# Patient Record
Sex: Male | Born: 1960 | Race: White | Hispanic: No | Marital: Married | Smoking: Former smoker
Health system: Southern US, Community
[De-identification: ages and names within clinical notes are randomized; demographics above are authoritative.]

## PROBLEM LIST (undated history)

## (undated) DIAGNOSIS — E119 Type 2 diabetes mellitus without complications: Secondary | ICD-10-CM

---

## 2001-11-04 ENCOUNTER — Emergency Department (HOSPITAL_COMMUNITY): Admission: EM | Admit: 2001-11-04 | Discharge: 2001-11-05 | Payer: Self-pay | Admitting: Emergency Medicine

## 2009-03-25 ENCOUNTER — Ambulatory Visit (HOSPITAL_COMMUNITY): Admission: RE | Admit: 2009-03-25 | Discharge: 2009-03-25 | Payer: Self-pay | Admitting: Cardiology

## 2009-04-25 ENCOUNTER — Ambulatory Visit: Payer: Self-pay | Admitting: Internal Medicine

## 2009-04-25 DIAGNOSIS — E669 Obesity, unspecified: Secondary | ICD-10-CM

## 2009-04-25 DIAGNOSIS — K7689 Other specified diseases of liver: Secondary | ICD-10-CM

## 2009-04-27 ENCOUNTER — Encounter: Payer: Self-pay | Admitting: Internal Medicine

## 2009-05-18 ENCOUNTER — Encounter: Payer: Self-pay | Admitting: Internal Medicine

## 2010-04-08 ENCOUNTER — Encounter: Payer: Self-pay | Admitting: Family Medicine

## 2010-04-17 NOTE — Letter (Signed)
Summary: [facility]   Imported By: Diana Eves 04/27/2009 10:41:45  _____________________________________________________________________  External Attachment:    Type:   Image     Comment:   External Document

## 2010-04-17 NOTE — Assessment & Plan Note (Signed)
Summary: NPP/FATTY LIVER/GU   Visit Type:  Consult Primary Care Provider:  Patrica Duel, MD  Chief Complaint:  fatty liver.  History of Present Illness: [name] is a pleasant 50 year old Caucasian gentleman, patient of Dr. Patrica Duel, who presents today for further evaluation of fatty liver. He was seen by Melony Overly, PA-C with Dr. Nobie Putnam. He presented to her with complaints of possible hernia. He noted a bulge at the top of the stomach. He states that it is uncomfortable at times. He just feels an achiness in his abdomen at times but no discrete pain. Denies any problems with his bowel movements. He usually has 1-2 stools per day. Denies any blood in stool or melena. No fever or chills. No nausea or vomiting. He has heartburn occasionally with certain foods. He had CT abd recently which showed fatty infiltration of the liver. There was slight bulging of the anterior abdominal wall at the umbilicus but no frank hernia. Spleen appeared normal. Recent labs showed normal LFTs, CBC, TSH was 5.14 He was started on Synthroid.  He c/o pp fecal urgency at times when he eats out.Marland Kitchen He's concerned about his abdominal girth. He admits to eating frequent fried foods. He basically gets very little exercise outside of walking with his job. 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 He denies any alcohol use and no prior history.  He's never been a smoker. He denies any family history of liver disease.       Current Medications (verified): 1)  Levothyroxine Sodium 25 Mcg Tabs (Levothyroxine Sodium) .... Once Daily 2)  Advil 200 Mg Tabs (Ibuprofen) .... As Needed  Allergies (verified): No Known Drug Allergies  Past History:  Past Medical History: Hypothyroidism  Past Surgical History: Unremarkable  Family History: Father, prostate cancer Mother, breast cancer Paternal uncle, throat cancer Paternal Aunt, pancreatic cancer Maternal aunt, pancreatic cancer No FH of  CRC, liver disease.  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 One son, age 60.  Review of Systems General:  Denies fever, chills, sweats, anorexia, weakness, and weight loss. Eyes:  Denies vision loss. ENT:  Denies nasal congestion, hoarseness, and difficulty swallowing. CV:  Denies chest pains, angina, palpitations, dyspnea on exertion, and peripheral edema. Resp:  Denies dyspnea at rest, dyspnea with exercise, and cough. GI:  See HPI; Denies difficulty swallowing, pain on swallowing, and gas/bloating. GU:  Denies urinary burning and blood in urine. MS:  Denies joint pain / LOM. Derm:  Denies rash and itching. Neuro:  Denies weakness, frequent headaches, memory loss, and confusion. Psych:  Denies depression and anxiety. Endo:  Denies unusual weight change. Heme:  Denies bruising and bleeding. Allergy:  Denies hives and rash.  Vital Signs:  Patient profile:   50 year old male Height:      72 inches Weight:      223 pounds BMI:     30.35 Temp:     98.1 degrees F oral Pulse rate:   76 / minute BP sitting:   122 / 84  (left arm) Cuff size:   regular  Vitals Entered By: Hendricks Limes LPN (April 25, 2009 10:44 AM)  Nutrition Counseling: Patient's BMI is greater than 25 and therefore counseled on weight management options.  Physical Exam  General:  Well developed, well nourished, no acute distress.obese.   Head:  Normocephalic and atraumatic. Eyes:  Conjunctivae pink, no scleral icterus.  Mouth:  Oropharyngeal mucosa moist, pink.  No lesions,  erythema or exudate.    Neck:  Supple; no masses or thyromegaly. Lungs:  Clear throughout to auscultation. Heart:  Regular rate and rhythm; no murmurs, rubs,  or bruits. Abdomen:  normal bowel sounds and obese.  Ventral weakness. Mild tenderness in midline above umbilicus. No rebound or guarding. No HSM or masses. No abd bruit.  Extremities:  No clubbing, cyanosis, edema or deformities  noted. Neurologic:  Alert and  oriented x4;  grossly normal neurologically. Skin:  Intact without significant lesions or rashes. Cervical Nodes:  No significant cervical adenopathy. Psych:  Alert and cooperative. Normal mood and affect.  Impression & Recommendations:  Problem # 1:  FATTY LIVER DISEASE (ICD-571.8)  Recent CT done to r/o ventral hernia demonstrated fatty liver. No hepatosplenomegaly. His LFTs are normal which is reassuring. Discussed importance in weight control. His BMI is >30, classified as obese. Recommend low-fat diet, 30 mins of brisk walking daily, goal of 1-2 pound weight loss per week until at healthy BMI. Goal of 10 pounds over the next 2 months. OV in six months with Dr. Jena Gauss. Fatty liver information pamphlet provided. Will also retrieve old endo reports from [facility] for review.   Time spent face-to-face encounter greater than 30 mins.   Orders: Consultation Level III ([id number]) I would like to thank Dr. Nobie Putnam for allowing [place] to take part in the care of this nice patient.   Appended Document: NPP/FATTY LIVER/GU EGD by Dr. Jena Gauss, 11/98 --> 2cm hiatal hernia, otherwise normal. No prior TCS. Recommend TCS at age 33, 06/2011.

## 2010-04-17 NOTE — Op Note (Signed)
Summary: EGD 1998  EGD 1998   Imported By: Diana Eves 05/18/2009 10:54:50  _____________________________________________________________________  External Attachment:    Type:   Image     Comment:   External Document

## 2011-05-03 IMAGING — CT CT ABDOMEN W/ CM
3 of 5 series · 17 of 46 positions shown, 19 images · IV contrast (Omnipaque 300)
Comparison: None.

CLINICAL DATA: Ventral hernia

CT ABDOMEN WITH CONTRAST
TECHNIQUE: Multidetector CT imaging of the abdomen was performed
using the standard protocol following bolus administration of
intravenous contrast.
Contrast: 100 ml 1mnipaque-T22

[Series 3: lung with 5.0 b60f · axial · 0.80mm/px · z∈[-15,+10]mm · 2 of 22 slices shown]
[im 6/22  bone]
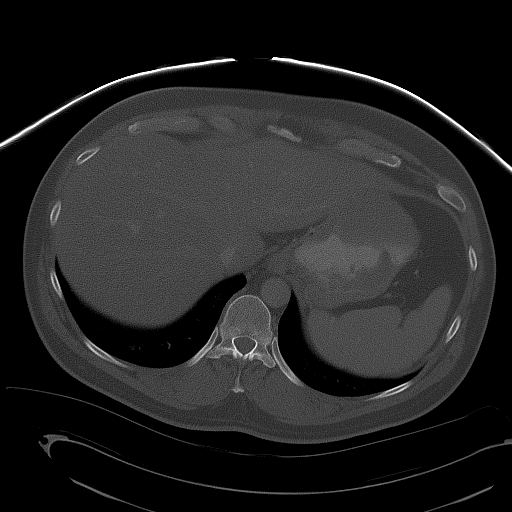
[im 11/22  bone]
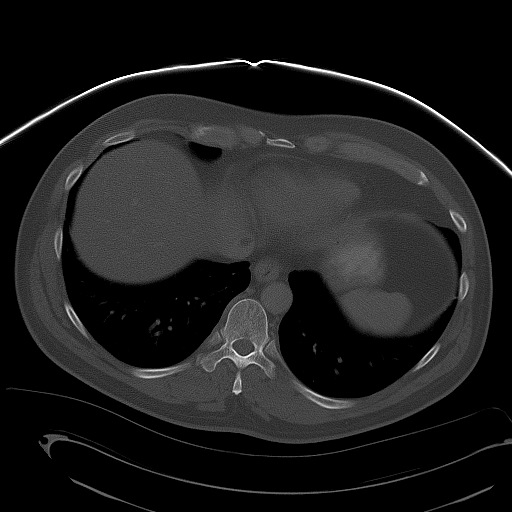

[Series 4: mpr cor post contrast (id) · coronal · 0.68mm/px · 3 of 98 slices shown]
[im 33/98  soft-tissue]
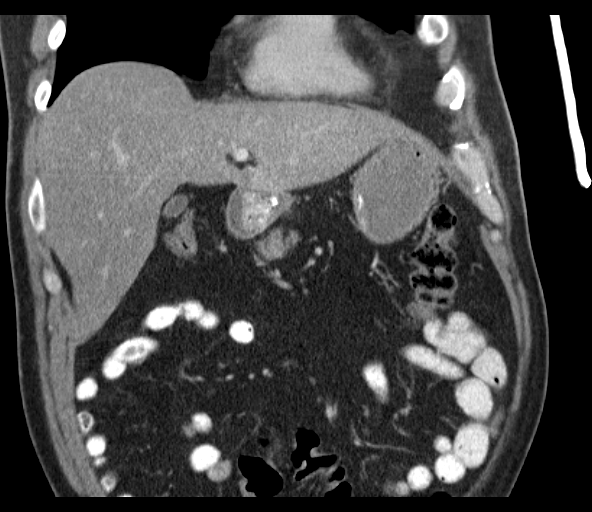
[im 44/98  soft-tissue]
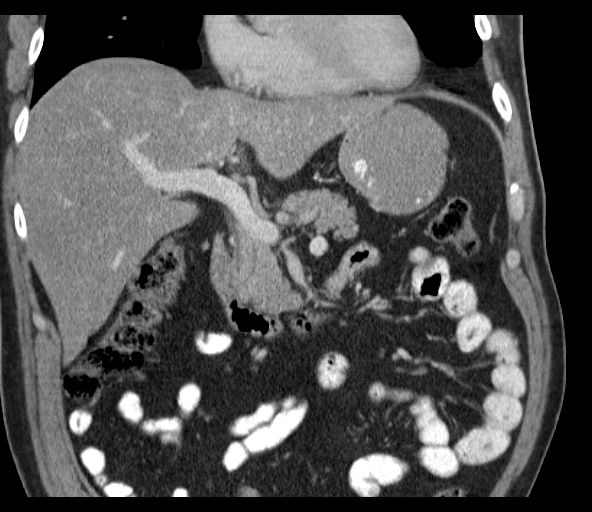
[im 54/98  soft-tissue]
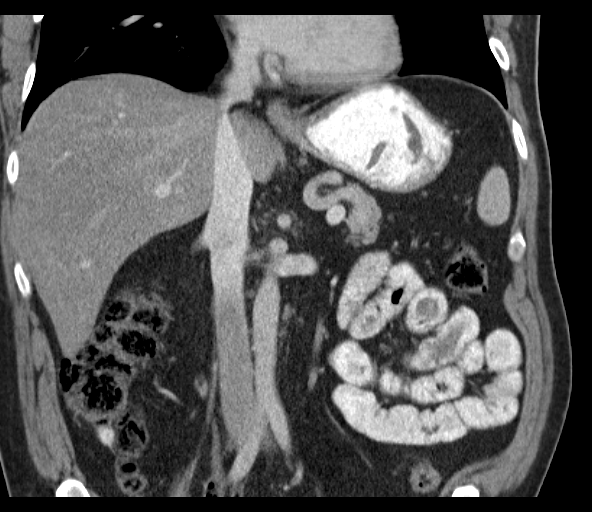

[Series 7: delay kidney · axial · delayed · 0.91mm/px · z∈[-235,+40]mm · 12 of 66 slices shown, 14 images]
[im 6/66  soft-tissue]
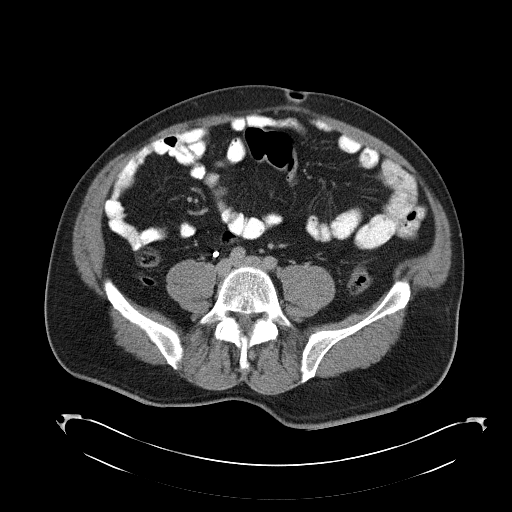
[im 6/66  bone]
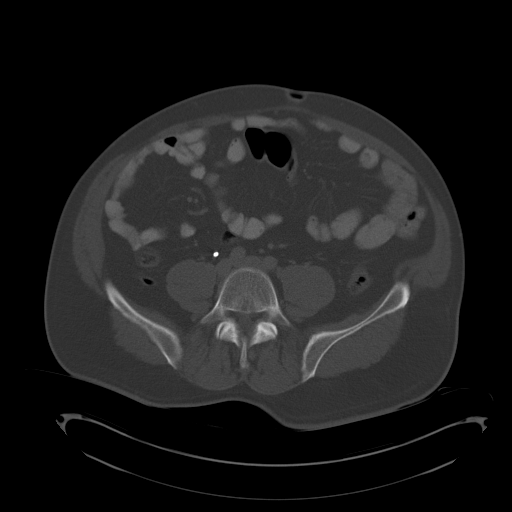
[im 11/66  soft-tissue]
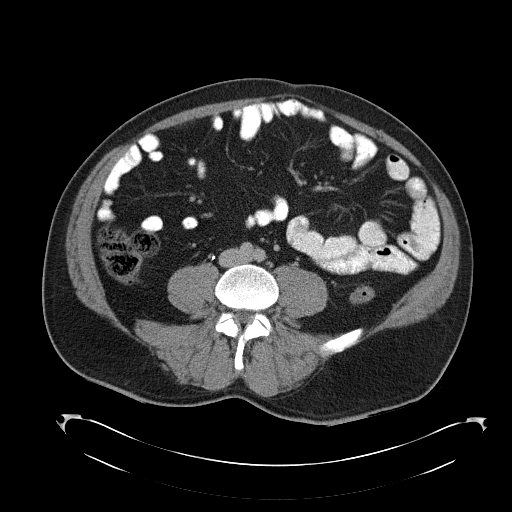
[im 16/66  soft-tissue]
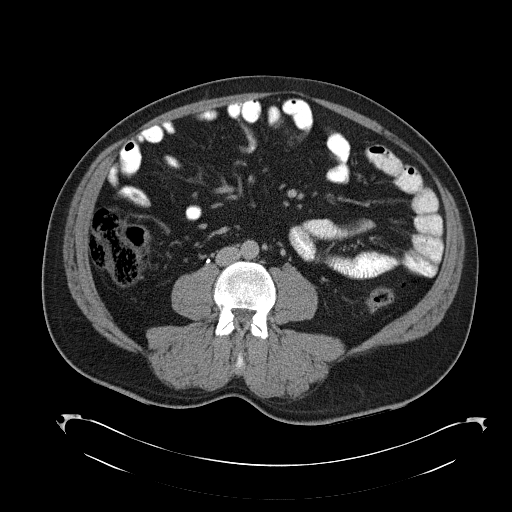
[im 21/66  soft-tissue]
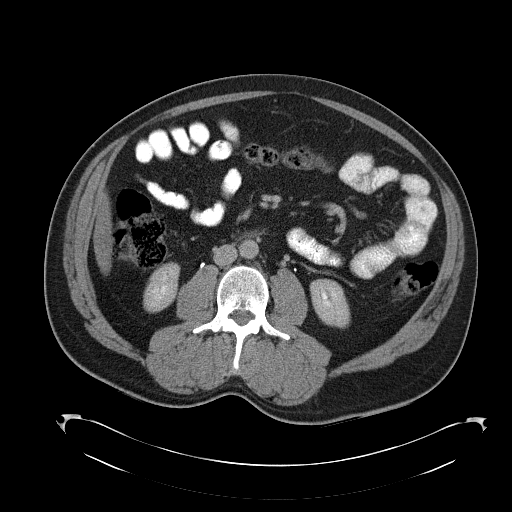
[im 26/66  soft-tissue]
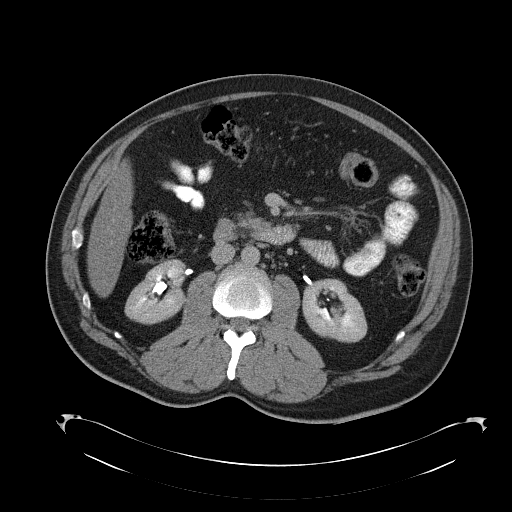
[im 31/66  soft-tissue]
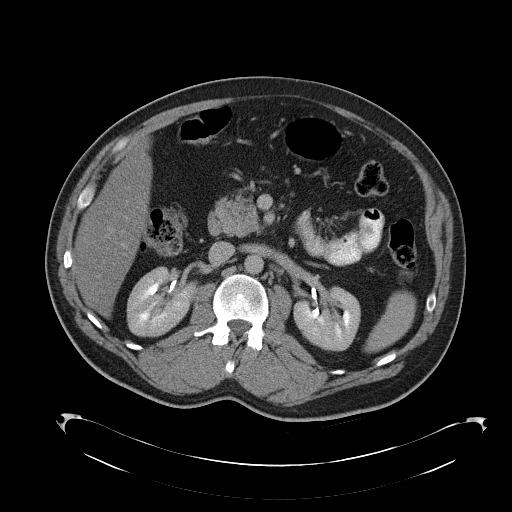
[im 36/66  soft-tissue]
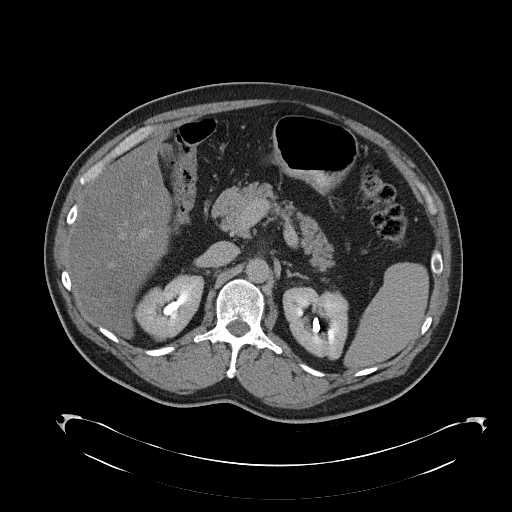
[im 41/66  soft-tissue]
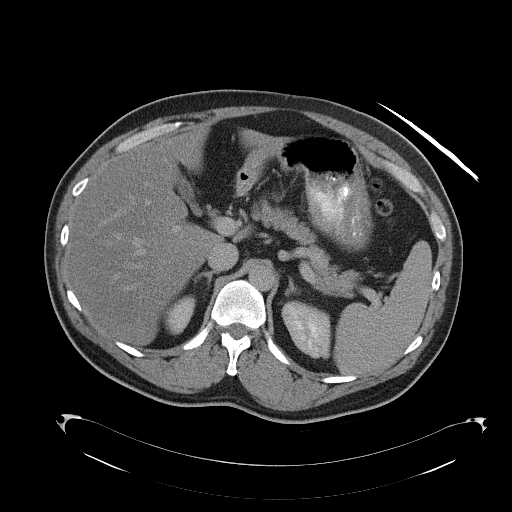
[im 46/66  soft-tissue]
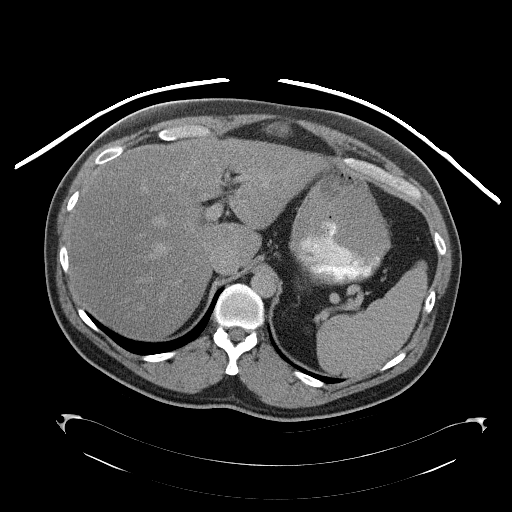
[im 46/66  bone]
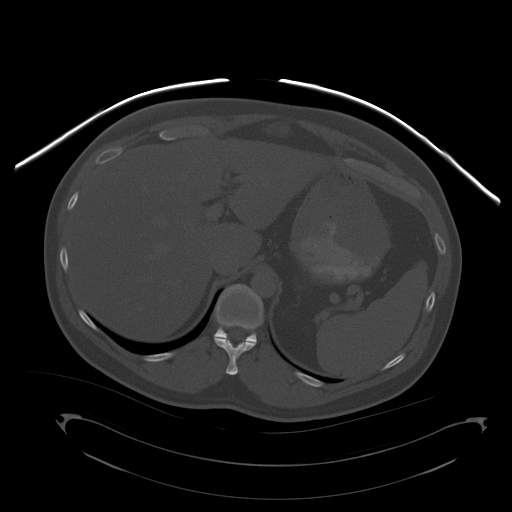
[im 51/66  soft-tissue]
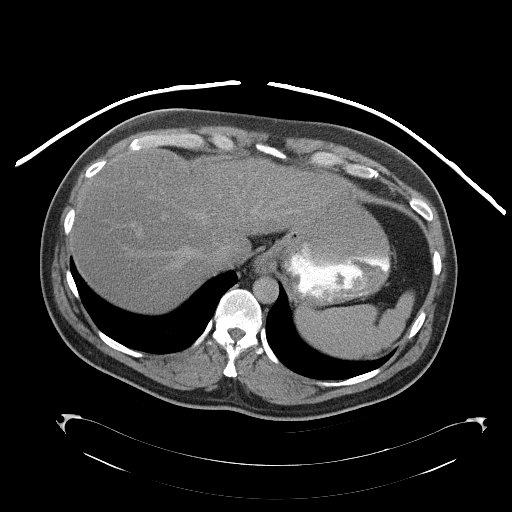
[im 56/66  soft-tissue]
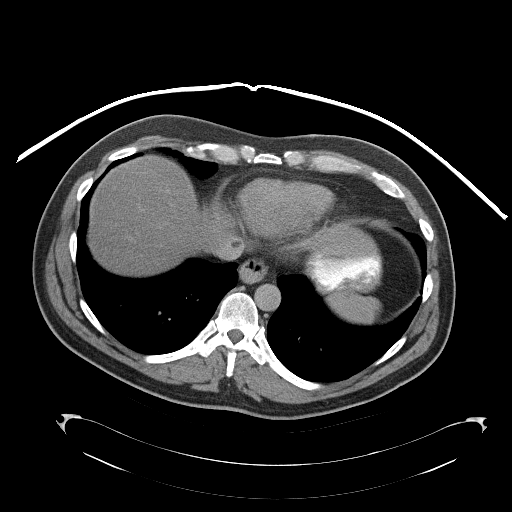
[im 61/66  soft-tissue]
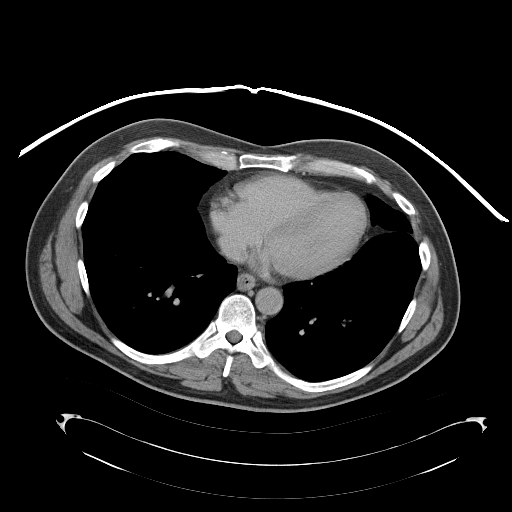

[17 of 46 positions shown; findings below may reference images not displayed]

FINDINGS: Diffuse fatty infiltration of the liver.  Gallbladder is
decompressed.  Spleen, pancreas, right kidney are within normal
limits.  Simple cyst in the upper pole of the left kidney.  There
is slight outward bulging of the anterior abdominal wall at the
umbilicus but no frank hernia.  The appendix is partially imaged.
The visualized portion is unremarkable.  Bowel is decompressed.  No
free fluid.  No abnormal adenopathy.
IMPRESSION: Fatty liver.

Simple cyst in the left kidney.

No definite ventral hernia.

## 2011-06-11 ENCOUNTER — Encounter: Payer: Self-pay | Admitting: Internal Medicine

## 2017-04-15 ENCOUNTER — Ambulatory Visit: Payer: No Typology Code available for payment source | Admitting: Urology

## 2017-04-15 DIAGNOSIS — R972 Elevated prostate specific antigen [PSA]: Secondary | ICD-10-CM | POA: Diagnosis not present

## 2017-04-16 ENCOUNTER — Other Ambulatory Visit: Payer: Self-pay | Admitting: Urology

## 2017-04-16 DIAGNOSIS — R972 Elevated prostate specific antigen [PSA]: Secondary | ICD-10-CM

## 2017-04-29 ENCOUNTER — Ambulatory Visit (HOSPITAL_COMMUNITY)
Admission: RE | Admit: 2017-04-29 | Discharge: 2017-04-29 | Disposition: A | Discharge status: Home / Self Care | Payer: No Typology Code available for payment source | Source: Ambulatory Visit | Attending: Urology | Admitting: Urology

## 2017-04-29 ENCOUNTER — Encounter (HOSPITAL_COMMUNITY): Payer: Self-pay

## 2017-04-29 DIAGNOSIS — Z79899 Other long term (current) drug therapy: Secondary | ICD-10-CM | POA: Diagnosis not present

## 2017-04-29 DIAGNOSIS — Z7984 Long term (current) use of oral hypoglycemic drugs: Secondary | ICD-10-CM | POA: Insufficient documentation

## 2017-04-29 DIAGNOSIS — Z8042 Family history of malignant neoplasm of prostate: Secondary | ICD-10-CM | POA: Insufficient documentation

## 2017-04-29 DIAGNOSIS — N521 Erectile dysfunction due to diseases classified elsewhere: Secondary | ICD-10-CM | POA: Diagnosis not present

## 2017-04-29 DIAGNOSIS — E119 Type 2 diabetes mellitus without complications: Secondary | ICD-10-CM | POA: Insufficient documentation

## 2017-04-29 DIAGNOSIS — N4231 Prostatic intraepithelial neoplasia: Secondary | ICD-10-CM | POA: Diagnosis not present

## 2017-04-29 DIAGNOSIS — R972 Elevated prostate specific antigen [PSA]: Secondary | ICD-10-CM | POA: Diagnosis not present

## 2017-04-29 DIAGNOSIS — K219 Gastro-esophageal reflux disease without esophagitis: Secondary | ICD-10-CM | POA: Insufficient documentation

## 2017-04-29 DIAGNOSIS — I771 Stricture of artery: Secondary | ICD-10-CM | POA: Diagnosis not present

## 2017-04-29 HISTORY — DX: Type 2 diabetes mellitus without complications: E11.9

## 2017-04-29 MED ORDER — LIDOCAINE HCL (PF) 2 % IJ SOLN
INTRAMUSCULAR | Status: AC
  Administered 2017-04-29: 10 mL
  Filled 2017-04-29: qty 10

## 2017-04-29 MED ORDER — GENTAMICIN SULFATE 40 MG/ML IJ SOLN
INTRAMUSCULAR | Status: AC
  Administered 2017-04-29: 160 mg via INTRAMUSCULAR
  Filled 2017-04-29: qty 4

## 2017-04-29 MED ORDER — LIDOCAINE HCL (PF) 2 % IJ SOLN
10.0000 mL | Freq: Once | INTRAMUSCULAR | Status: AC
  Administered 2017-04-29: 10 mL

## 2017-04-29 MED ORDER — GENTAMICIN SULFATE 40 MG/ML IJ SOLN
160.0000 mg | Freq: Once | INTRAMUSCULAR | Status: AC
  Administered 2017-04-29: 160 mg via INTRAMUSCULAR

## 2017-04-29 NOTE — Discharge Instructions (Signed)
Transrectal Ultrasound-Guided Biopsy °A transrectal ultrasound-guided biopsy is a procedure to remove samples of tissue from your prostate using ultrasound images to guide the procedure. The procedure is usually done to evaluate the prostate gland of men who have an elevated prostate-specific antigen (PSA). PSA is a blood test to screen for prostate cancer. The biopsy samples are taken to check for prostate cancer. °Tell a health care provider about: °· Any allergies you have. °· All medicines you are taking, including vitamins, herbs, eye drops, creams, and over-the-counter medicines. °· Any problems you or family members have had with anesthetic medicines. °· Any blood disorders you have. °· Any surgeries you have had. °· Any medical conditions you have. °What are the risks? °Generally, this is a safe procedure. However, as with any procedure, problems can occur. Possible problems include: °· Infection of your prostate. °· Bleeding from your rectum or blood in your urine. °· Difficulty urinating. °· Nerve damage (this is usually temporary). °· Damage to surrounding structures such as blood vessels, organs, and muscles, which would require other procedures. ° °What happens before the procedure? °· Do not eat or drink anything after midnight on the night before the procedure or as directed by your health care provider. °· Take medicines only as directed by your health care provider. °· Your health care provider may have you stop taking certain medicines 5-7 days before the procedure. °· You will be given an enema before the procedure. During an enema, a liquid is injected into your rectum to clear out waste. °· You may have lab tests the day of your procedure. °· Plan to have someone take you home after the procedure. °What happens during the procedure? °· You will be given medicine to help you relax (sedative) before the procedure. An IV tube will be inserted into one of your veins and used to give fluids and  medicine. °· You will be given antibiotic medicine to reduce the risk of an infection. °· You will be placed on your side for the procedure. °· A probe with lubricated gel will be placed into your rectum, and images will be taken of your prostate and surrounding structures. °· Numbing medicine will be injected into the prostate before the biopsy samples are taken. °· A biopsy needle will then be inserted and guided to your prostate with the use of the ultrasound images. °· Samples of prostate tissue will be taken, and the needle will then be removed. °· The biopsy samples will be sent to a lab to be analyzed. Results are usually back in 2-3 days. °What happens after the procedure? °· You will be taken to a recovery area where you will be monitored. °· You may have some discomfort in the rectal area. You will be given pain medicines to control this. °· You may be allowed to go home the same day, or you may need to stay in the hospital overnight. °This information is not intended to replace advice given to you by your health care provider. Make sure you discuss any questions you have with your health care provider. °Document Released: 07/19/2013 Document Revised: 08/10/2015 Document Reviewed: 10/21/2012 °Elsevier Interactive Patient Education © 2018 [employer] ° °

## 2017-11-04 ENCOUNTER — Ambulatory Visit (INDEPENDENT_AMBULATORY_CARE_PROVIDER_SITE_OTHER): Payer: No Typology Code available for payment source | Admitting: Urology

## 2017-11-04 DIAGNOSIS — R972 Elevated prostate specific antigen [PSA]: Secondary | ICD-10-CM | POA: Diagnosis not present

## 2020-02-25 DIAGNOSIS — R7989 Other specified abnormal findings of blood chemistry: Secondary | ICD-10-CM | POA: Diagnosis not present

## 2020-04-21 DIAGNOSIS — E663 Overweight: Secondary | ICD-10-CM | POA: Diagnosis not present

## 2020-04-21 DIAGNOSIS — E291 Testicular hypofunction: Secondary | ICD-10-CM | POA: Diagnosis not present

## 2020-04-21 DIAGNOSIS — E119 Type 2 diabetes mellitus without complications: Secondary | ICD-10-CM | POA: Diagnosis not present

## 2020-04-21 DIAGNOSIS — E1165 Type 2 diabetes mellitus with hyperglycemia: Secondary | ICD-10-CM | POA: Diagnosis not present

## 2020-04-21 DIAGNOSIS — Z6829 Body mass index (BMI) 29.0-29.9, adult: Secondary | ICD-10-CM | POA: Diagnosis not present

## 2020-04-21 DIAGNOSIS — I10 Essential (primary) hypertension: Secondary | ICD-10-CM | POA: Diagnosis not present

## 2020-05-03 DIAGNOSIS — J22 Unspecified acute lower respiratory infection: Secondary | ICD-10-CM | POA: Diagnosis not present

## 2020-07-19 DIAGNOSIS — E119 Type 2 diabetes mellitus without complications: Secondary | ICD-10-CM | POA: Diagnosis not present

## 2020-07-21 DIAGNOSIS — E291 Testicular hypofunction: Secondary | ICD-10-CM | POA: Diagnosis not present

## 2020-07-21 DIAGNOSIS — E7849 Other hyperlipidemia: Secondary | ICD-10-CM | POA: Diagnosis not present

## 2020-07-21 DIAGNOSIS — Z1389 Encounter for screening for other disorder: Secondary | ICD-10-CM | POA: Diagnosis not present

## 2020-07-21 DIAGNOSIS — E1165 Type 2 diabetes mellitus with hyperglycemia: Secondary | ICD-10-CM | POA: Diagnosis not present

## 2020-07-21 DIAGNOSIS — Z6829 Body mass index (BMI) 29.0-29.9, adult: Secondary | ICD-10-CM | POA: Diagnosis not present

## 2020-07-21 DIAGNOSIS — Z1331 Encounter for screening for depression: Secondary | ICD-10-CM | POA: Diagnosis not present

## 2020-07-21 DIAGNOSIS — R972 Elevated prostate specific antigen [PSA]: Secondary | ICD-10-CM | POA: Diagnosis not present

## 2020-07-21 DIAGNOSIS — E663 Overweight: Secondary | ICD-10-CM | POA: Diagnosis not present

## 2020-09-29 DIAGNOSIS — E291 Testicular hypofunction: Secondary | ICD-10-CM | POA: Diagnosis not present

## 2020-10-27 DIAGNOSIS — I10 Essential (primary) hypertension: Secondary | ICD-10-CM | POA: Diagnosis not present

## 2020-10-27 DIAGNOSIS — E291 Testicular hypofunction: Secondary | ICD-10-CM | POA: Diagnosis not present

## 2020-10-27 DIAGNOSIS — E119 Type 2 diabetes mellitus without complications: Secondary | ICD-10-CM | POA: Diagnosis not present

## 2020-10-27 DIAGNOSIS — E663 Overweight: Secondary | ICD-10-CM | POA: Diagnosis not present

## 2020-10-27 DIAGNOSIS — E039 Hypothyroidism, unspecified: Secondary | ICD-10-CM | POA: Diagnosis not present

## 2020-10-27 DIAGNOSIS — Z0001 Encounter for general adult medical examination with abnormal findings: Secondary | ICD-10-CM | POA: Diagnosis not present

## 2020-10-27 DIAGNOSIS — E785 Hyperlipidemia, unspecified: Secondary | ICD-10-CM | POA: Diagnosis not present

## 2020-10-27 DIAGNOSIS — Z6829 Body mass index (BMI) 29.0-29.9, adult: Secondary | ICD-10-CM | POA: Diagnosis not present

## 2020-10-27 DIAGNOSIS — Z23 Encounter for immunization: Secondary | ICD-10-CM | POA: Diagnosis not present

## 2020-10-27 DIAGNOSIS — R2233 Localized swelling, mass and lump, upper limb, bilateral: Secondary | ICD-10-CM | POA: Diagnosis not present

## 2020-12-11 DIAGNOSIS — M79642 Pain in left hand: Secondary | ICD-10-CM | POA: Diagnosis not present

## 2020-12-11 DIAGNOSIS — M064 Inflammatory polyarthropathy: Secondary | ICD-10-CM | POA: Diagnosis not present

## 2020-12-11 DIAGNOSIS — M79641 Pain in right hand: Secondary | ICD-10-CM | POA: Diagnosis not present

## 2020-12-11 DIAGNOSIS — R768 Other specified abnormal immunological findings in serum: Secondary | ICD-10-CM | POA: Diagnosis not present

## 2020-12-11 DIAGNOSIS — R5383 Other fatigue: Secondary | ICD-10-CM | POA: Diagnosis not present

## 2021-01-09 DIAGNOSIS — R768 Other specified abnormal immunological findings in serum: Secondary | ICD-10-CM | POA: Diagnosis not present

## 2021-01-09 DIAGNOSIS — M064 Inflammatory polyarthropathy: Secondary | ICD-10-CM | POA: Diagnosis not present

## 2022-03-05 DIAGNOSIS — R1032 Left lower quadrant pain: Secondary | ICD-10-CM | POA: Diagnosis present

## 2022-03-06 ENCOUNTER — Other Ambulatory Visit: Payer: Self-pay

## 2022-03-06 ENCOUNTER — Emergency Department (HOSPITAL_COMMUNITY)
Admission: EM | Admit: 2022-03-06 | Discharge: 2022-03-06 | Disposition: A | Discharge status: Home / Self Care | Payer: BC Managed Care – PPO | Attending: Emergency Medicine | Admitting: Emergency Medicine

## 2022-03-06 ENCOUNTER — Emergency Department (HOSPITAL_COMMUNITY): Payer: BC Managed Care – PPO

## 2022-03-06 DIAGNOSIS — R109 Unspecified abdominal pain: Secondary | ICD-10-CM

## 2022-03-06 LAB — URINALYSIS, ROUTINE W REFLEX MICROSCOPIC
Bacteria, UA: NONE SEEN
Bilirubin Urine: NEGATIVE
Glucose, UA: >=500 mg/dL — AB
Hgb urine dipstick: NEGATIVE
Ketones, ur: NEGATIVE mg/dL
Leukocytes,Ua: NEGATIVE
Nitrite: NEGATIVE
Protein, ur: NEGATIVE mg/dL
Specific Gravity, Urine: 1.03 (ref 1.005–1.030)
pH: 5 (ref 5.0–8.0)

## 2022-03-06 LAB — CBC
HCT: 46.3 % (ref 39.0–52.0)
Hemoglobin: 16.7 g/dL (ref 13.0–17.0)
MCH: 29.9 pg (ref 26.0–34.0)
MCHC: 36.1 g/dL — ABNORMAL HIGH (ref 30.0–36.0)
MCV: 83 fL (ref 80.0–100.0)
Platelets: 374 10*3/uL (ref 150–400)
RBC: 5.58 MIL/uL (ref 4.22–5.81)
RDW: 13 % (ref 11.5–15.5)
WBC: 22.4 10*3/uL — ABNORMAL HIGH (ref 4.0–10.5)
nRBC: 0 % (ref 0.0–0.2)

## 2022-03-06 LAB — BASIC METABOLIC PANEL
Anion gap: 9 (ref 5–15)
BUN: 28 mg/dL — ABNORMAL HIGH (ref 6–20)
CO2: 19 mmol/L — ABNORMAL LOW (ref 22–32)
Calcium: 9.1 mg/dL (ref 8.9–10.3)
Chloride: 110 mmol/L (ref 98–111)
Creatinine, Ser: 1.05 mg/dL (ref 0.61–1.24)
GFR, Estimated: >60 mL/min (ref >60)
Glucose, Bld: 261 mg/dL — ABNORMAL HIGH (ref 70–99)
Potassium: 4.1 mmol/L (ref 3.5–5.1)
Sodium: 138 mmol/L (ref 135–145)

## 2022-03-06 MED ORDER — SODIUM CHLORIDE 0.9 % IV BOLUS
1000.0000 mL | Freq: Once | INTRAVENOUS | Status: AC
  Administered 2022-03-06: 1000 mL via INTRAVENOUS

## 2022-03-06 MED ORDER — IOHEXOL 300 MG/ML  SOLN
100.0000 mL | Freq: Once | INTRAMUSCULAR | Status: AC | PRN
  Administered 2022-03-06: 100 mL via INTRAVENOUS

## 2022-03-06 NOTE — ED Provider Notes (Signed)
[facility] Provider Note   CSN: [id number] Arrival date & time: 03/05/22  2315     History  Chief Complaint  Patient presents with   Flank Pain    Luis Mayer is a 61 y.o. male.  Patient is a 61 year old male with no significant past medical history.  Patient presenting with a 2-day history of left lower quadrant and flank pain.  He also reports feeling constipated and having difficulty having a bowel movement.  He denies any fevers or chills.  He also describes some dysuria that started this evening.  He denies any aggravating or alleviating factors.  The history is provided by the patient.       Home Medications Prior to Admission medications   Not on File      Allergies    Patient has no known allergies.    Review of Systems   Review of Systems  All other systems reviewed and are negative.   Physical Exam Updated Vital Signs BP (!) 143/84 (BP Location: Right Arm)   Pulse 92   Temp 97.7 F (36.5 C) (Oral)   Resp 20   Ht 6' (1.829 m)   Wt 99.8 kg   SpO2 98%   BMI 29.84 kg/m  Physical Exam Vitals and nursing note reviewed.  Constitutional:      General: He is not in acute distress.    Appearance: He is well-developed. He is not diaphoretic.  HENT:     Head: Normocephalic and atraumatic.  Cardiovascular:     Rate and Rhythm: Normal rate and regular rhythm.     Heart sounds: No murmur heard.    No friction rub.  Pulmonary:     Effort: Pulmonary effort is normal. No respiratory distress.     Breath sounds: Normal breath sounds. No wheezing or rales.  Abdominal:     General: Bowel sounds are normal. There is no distension.     Palpations: Abdomen is soft.     Tenderness: There is abdominal tenderness. There is left CVA tenderness. There is no right CVA tenderness, guarding or rebound.  Musculoskeletal:        General: Normal range of motion.     Cervical back: Normal range of motion and neck supple.  Skin:    General: Skin is  warm and dry.  Neurological:     Mental Status: He is alert and oriented to person, place, and time.     Coordination: Coordination normal.     ED Results / Procedures / Treatments   Labs (all labs ordered are listed, but only abnormal results are displayed) Labs Reviewed  CBC - Abnormal; Notable for the following components:      Result Value   WBC 22.4 (*)    MCHC 36.1 (*)    All other components within normal limits  URINALYSIS, ROUTINE W REFLEX MICROSCOPIC  BASIC METABOLIC PANEL    EKG None  Radiology No results found.  Procedures Procedures    Medications Ordered in ED Medications  sodium chloride 0.9 % bolus 1,000 mL (has no administration in time range)    ED Course/ Medical Decision Making/ A&P  Patient is a 61 year old male presenting with complaints of left lower quadrant and left flank pain since earlier today.  He reports not having had a bowel movement in 2 days.  He also describes some burning with urination.  He arrives here with stable vital signs and physical examination reveals left lower quadrant and left flank tenderness, but  is otherwise unremarkable.  Patient complained of some difficulty urinating and bladder scan was performed.  He had greater than 1000 cc in his bladder and this was drained with a Foley catheter.  Urine sent for urinalysis which was unremarkable.  CT renal protocol performed showing evidence for recently passed stone in the left renal system, but no other acute findings.  Laboratory studies obtained including CBC and metabolic panel.  Patient has a leukocytosis with white count of 23,000, because of which I am uncertain.  There are no findings on his CT scan that would explain this.  Patient to be discharged and is to follow-up as needed.  He does have some stool retention for which I will recommend magnesium citrate.  Final Clinical Impression(s) / ED Diagnoses Final diagnoses:  None    Rx / DC Orders ED Discharge Orders      None         Geoffery Lyons, MD 03/06/22 0401

## 2022-03-06 NOTE — ED Notes (Signed)
Patient transported to CT 

## 2022-03-06 NOTE — Discharge Instructions (Signed)
Take ibuprofen 600 mg every 6 hours as needed for pain.  Follow-up with your primary doctor and return to the ER if symptoms significantly worsen or change.

## 2022-03-06 NOTE — ED Triage Notes (Signed)
Pt c/o LLQ/Flank pain and constipation. Last normal bowel movement 2 days ago. Also c/o dysuria that began tonight.

## 2022-12-18 NOTE — Progress Notes (Signed)
H&P  Chief Complaint: Elevated PSA count  History of Present Illness:   2.12.2019: TRUS/Bx. Prostate volume 53.3.ml. PSA 6.4, PSAD 0.12. 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   10.8.2024: He has not been followed here since that biopsy.  Apparently, at [facility] he has had frequent PSA levels.  I do not have any of these results.  From he denies significant urinary difficulties.  He has not been treated for any urinary infections recently.  Seen in December 2023 with probable ureteral stone.  She did have a large prostate on the CT as well as renal cysts.    Past Medical History:  Diagnosis Date   Diabetes mellitus without complication (HCC)       Home Medications:  Allergies as of 12/24/2022   No Known Allergies      Medication List    as of December 18, 2022  2:26 PM   You have not been prescribed any medications.     Allergies: No Known Allergies  No family history on file.  Social History:  reports that he has quit smoking. He has never used smokeless tobacco. He reports that he does not drink alcohol and does not use drugs.  ROS: A complete review of systems was performed.  All systems are negative except for pertinent findings as noted.  Physical Exam:  Vital signs in last 24 hours: There were no vitals taken for this visit. Constitutional:  Alert and oriented, No acute distress Cardiovascular: Regular rate  Respiratory: Normal respiratory effort GI: No Ingal will hernias Genitourinary: Normal male phallus, testes are descended bilaterally and non-tender and without masses, scrotum is normal in appearance without lesions or masses, perineum is normal on inspection.  Normal anal sphincter tone.  Prostate 60 g, symmetric, nonnodular, nontender.  Lymphatic: No lymphadenopathy Neurologic: Grossly intact, no focal deficits Psychiatric: Normal mood and affect  I have reviewed prior pt notes  I have reviewed urinalysis results  I have independently  reviewed prior imaging--CT images reviewed.  Estimated prostate volume 98 mL  I have reviewed prior PSA and pathology results    Impression/Assessment:  1.  History of elevated PSA with negative biopsy in 2019.  Inadequate follow-up since that time  2.  BPH, estimated prostate volume 98 mL on most recent CT scan.  Does not have significant lower urinary tract symptoms  Plan:  1.  I will get all PSA data from [facility] that is not available today  2.  I will reach out to patient once have reviewed these in terms of his follow-up

## 2022-12-24 ENCOUNTER — Encounter: Payer: Self-pay | Admitting: Urology

## 2022-12-24 ENCOUNTER — Ambulatory Visit (INDEPENDENT_AMBULATORY_CARE_PROVIDER_SITE_OTHER): Payer: BC Managed Care – PPO | Admitting: Urology

## 2022-12-24 VITALS — BP 126/78 | HR 81

## 2022-12-24 DIAGNOSIS — R972 Elevated prostate specific antigen [PSA]: Secondary | ICD-10-CM

## 2022-12-24 DIAGNOSIS — N400 Enlarged prostate without lower urinary tract symptoms: Secondary | ICD-10-CM | POA: Diagnosis not present

## 2022-12-24 LAB — URINALYSIS, ROUTINE W REFLEX MICROSCOPIC
Bilirubin, UA: NEGATIVE
Ketones, UA: NEGATIVE
Leukocytes,UA: NEGATIVE
Nitrite, UA: NEGATIVE
Protein,UA: NEGATIVE
RBC, UA: NEGATIVE
Specific Gravity, UA: 1.01 (ref 1.005–1.030)
Urobilinogen, Ur: 0.2 mg/dL (ref 0.2–1.0)
pH, UA: 6 (ref 5.0–7.5)

## 2022-12-24 LAB — BLADDER SCAN AMB NON-IMAGING: Scan Result: 45
